# Patient Record
Sex: Male | Born: 1975 | Race: Asian | Hispanic: No | Marital: Married
Health system: Southern US, Community
[De-identification: ages and names within clinical notes are randomized; demographics above are authoritative.]

---

## 2013-03-24 ENCOUNTER — Ambulatory Visit: Payer: Self-pay | Admitting: Gastroenterology

## 2013-03-31 ENCOUNTER — Ambulatory Visit: Payer: Self-pay | Admitting: Gastroenterology

## 2014-12-14 ENCOUNTER — Other Ambulatory Visit: Payer: BC Managed Care – PPO

## 2014-12-14 DIAGNOSIS — Z9852 Vasectomy status: Secondary | ICD-10-CM

## 2014-12-15 LAB — SEMEN ANALYSIS, BASIC

## 2014-12-18 LAB — SPECIMEN STATUS REPORT

## 2014-12-18 LAB — POST-VAS SPERM EVALUATION,QUAL
POST-VAS SPERM, QUAL: ABSENT
SEMEN VOLUME: 2 mL

## 2014-12-20 ENCOUNTER — Telehealth: Payer: Self-pay

## 2014-12-20 NOTE — Telephone Encounter (Signed)
-----   Message from Fernanda Drum, FNP sent at 12/20/2014  8:14 AM EDT ----- Please notify patient that his semen analysis was negative.  thanks

## 2014-12-20 NOTE — Telephone Encounter (Signed)
Spoke with pt in reference to semen analysis. Pt voiced understanding.  

## 2015-03-05 IMAGING — RF DG BARIUM SWALLOW
11 of 16 series · 14 of 23 positions shown · non-contrast
Comparison: None.

FLUOROSCOPY TIME:  2 min 12 seconds.

CLINICAL DATA: Dysphagia.  Possible achalasia.

EXAM:
ESOPHOGRAM/BARIUM SWALLOW
TECHNIQUE: Combined double contrast and single contrast examination performed
using effervescent crystals, thick barium liquid, and thin barium
liquid.

[Series 1: fluoro_barium 2fps_bw · 0.19mm/px · 2 of 10 frames shown (1 of 11)]
[frame 2/10]
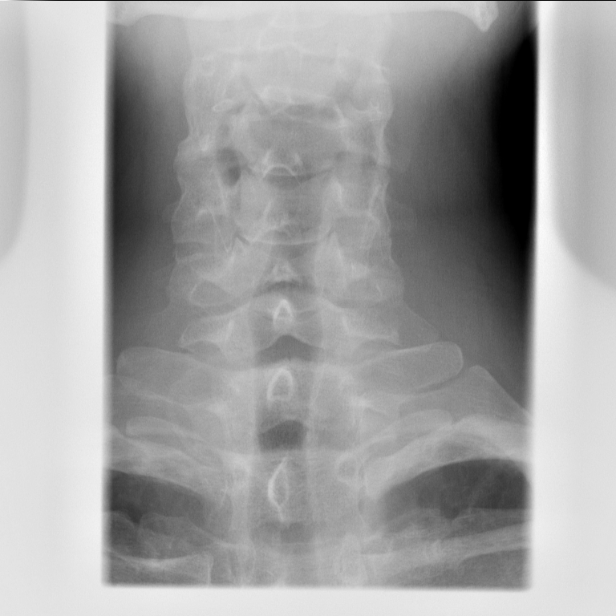
[frame 6/10]
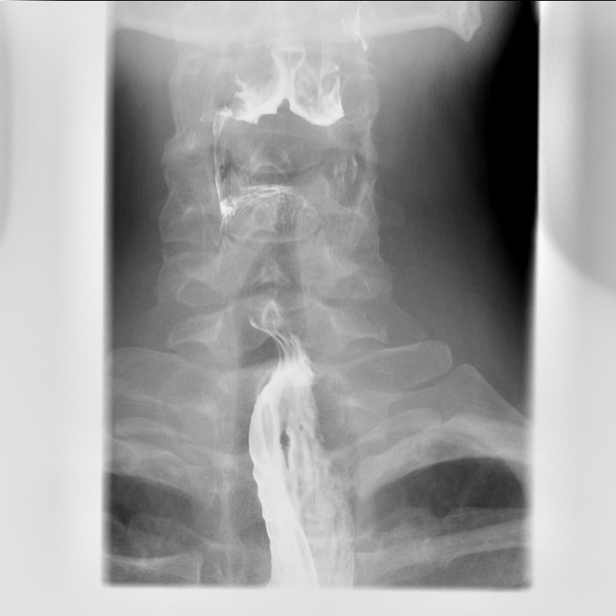

[Series 2: fluoro_barium 2fps_bw · 0.19mm/px · 2 of 7 frames shown (2 of 11)]
[frame 2/7]
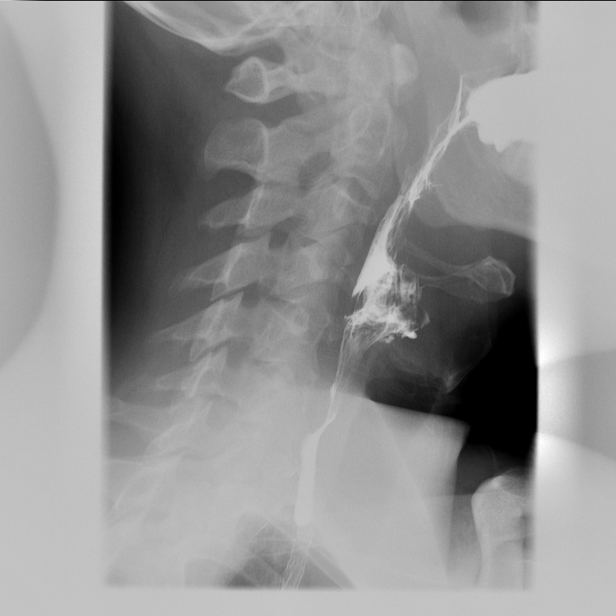
[frame 4/7]
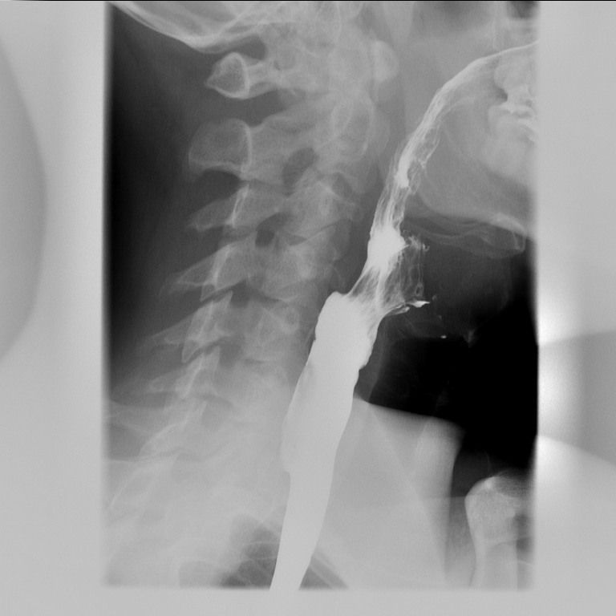

[Series 3: fluoro_barium 2fps_bw · 0.19mm/px · 1 of 1 slices shown (3 of 11)]
[im 1/1]
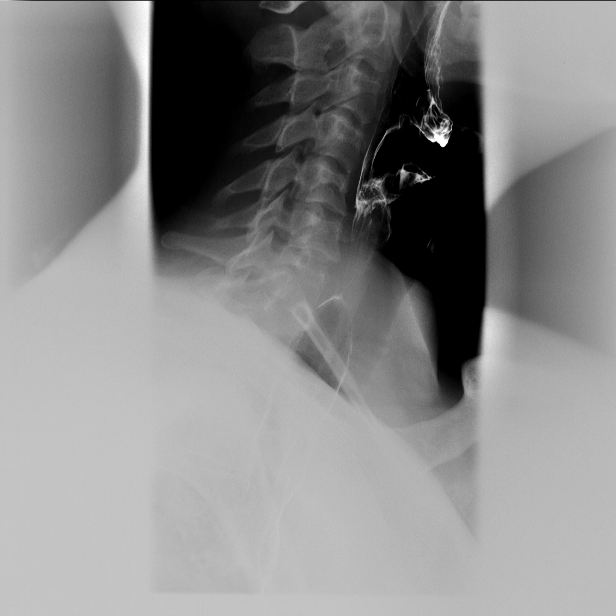

[Series 4: fluoro_barium 2fps_bw · 0.19mm/px · 2 of 11 frames shown (4 of 11)]
[frame 6/11]
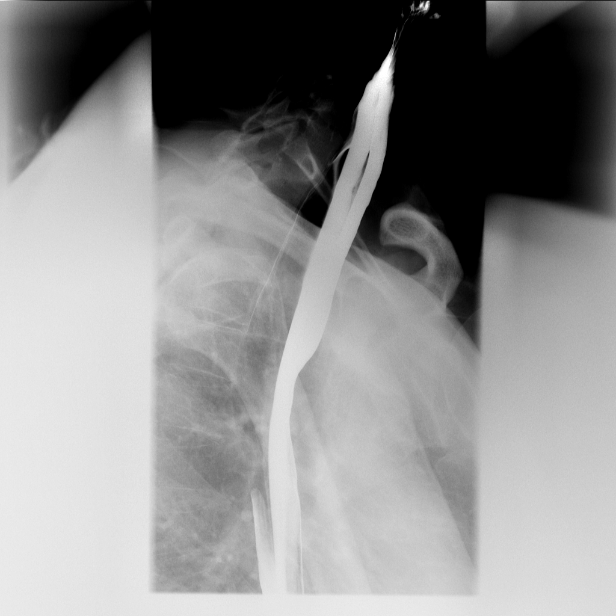
[frame 10/11]
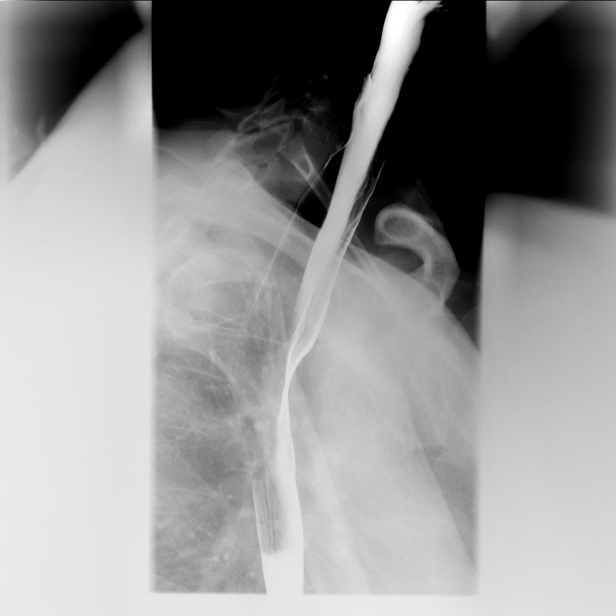

[Series 6: fluoro_barium 2fps_bw · 0.19mm/px · 1 of 1 slices shown (5 of 11)]
[im 1/1]
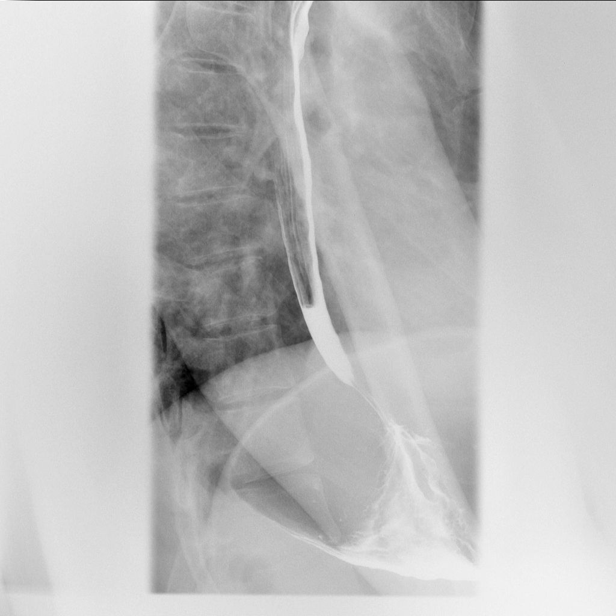

[Series 7: fluoro_barium 2fps_bw · 0.19mm/px · 1 of 1 slices shown (6 of 11)]
[im 1/1]
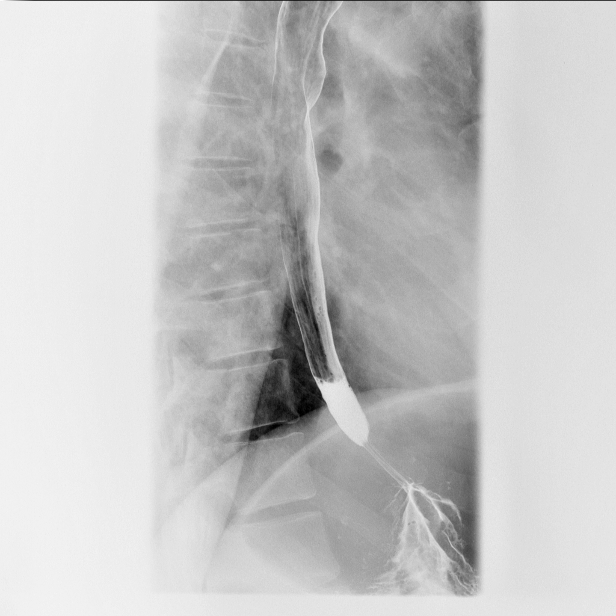

[Series 9: fluoro_barium 2fps_bw · 0.19mm/px · 1 of 1 slices shown (7 of 11)]
[im 1/1]
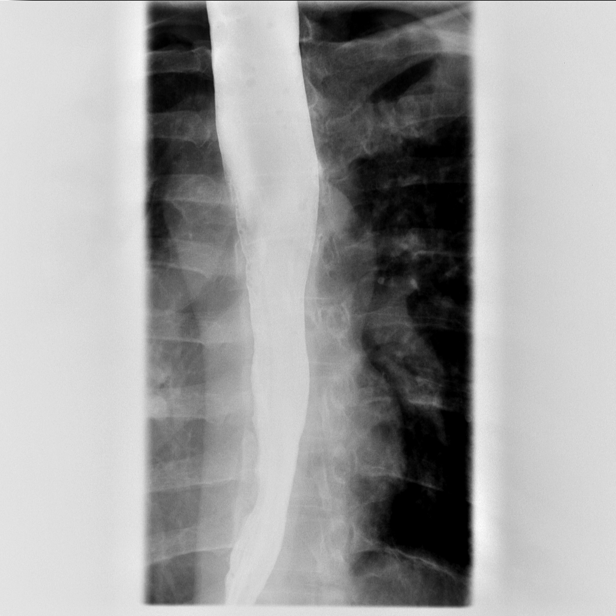

[Series 11: fluoro_barium 2fps_bw · 0.19mm/px · 1 of 1 slices shown (8 of 11)]
[im 1/1]
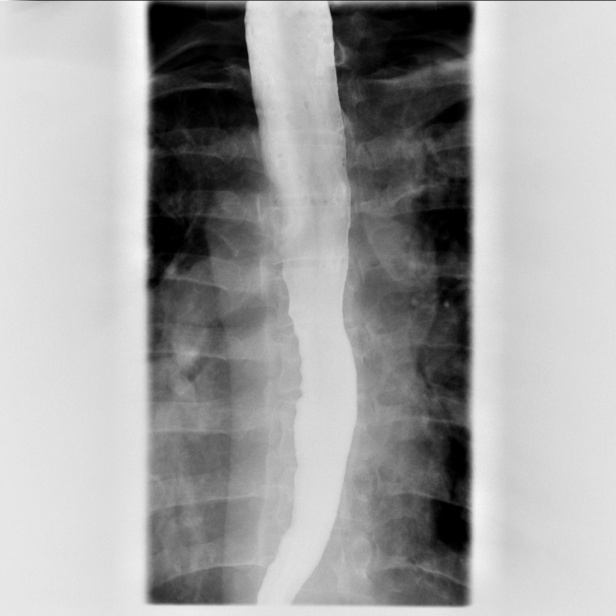

[Series 12: fluoro_barium 2fps_bw · 0.19mm/px · 1 of 1 slices shown (9 of 11)]
[im 1/1]
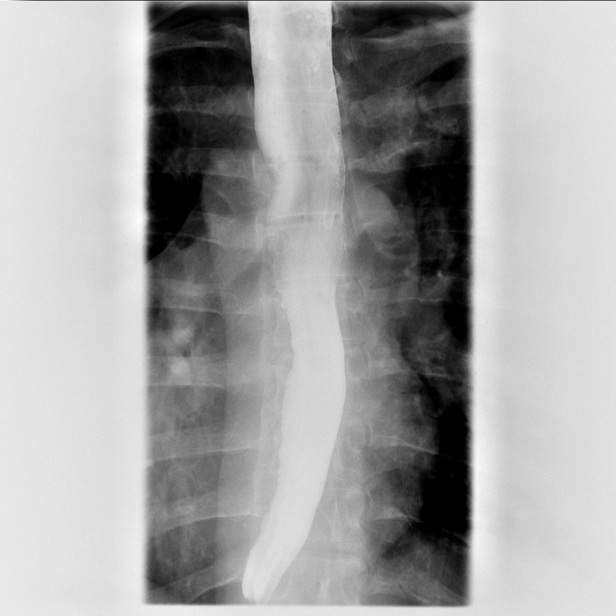

[Series 14: fluoro_barium 2fps_bw · 0.20mm/px · 1 of 1 slices shown (10 of 11)]
[im 1/1]
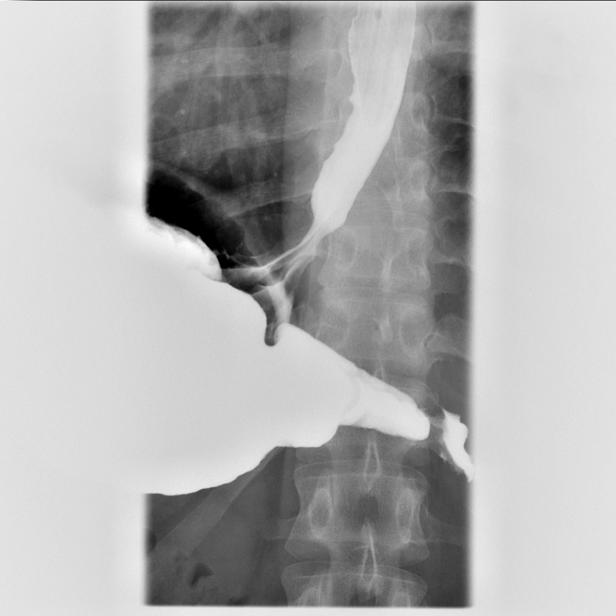

[Series 16: fluoro_barium 2fps_bw · 0.20mm/px · 1 of 1 slices shown (11 of 11)]
[im 1/1]
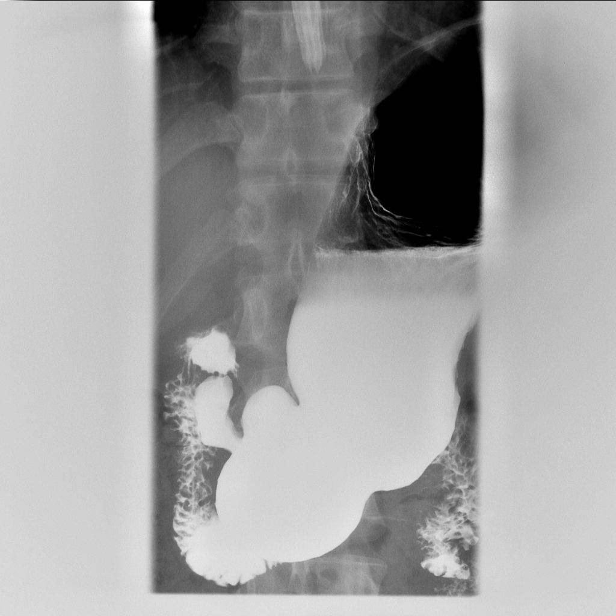

[14 of 23 positions shown; findings below may reference images not displayed]

FINDINGS: Cervical esophagus is normal. The thoracic esophagus is mildly to
moderately distended. No normal peristaltic waves are noted. Mild
tertiary contractions are present. The distal esophagus is slightly
narrowed. These findings suggest achalasia. Other etiologies of
these findings would include a neuropathy from any etiology and
collagen vascular disease such as scleroderma. No reflux could be
demonstrated. Barium tablet passed easily in an upright position. No
mass lesions.
IMPRESSION: Findings suggesting the possibility of achalasia. These results will
be called to the ordering clinician or representative by the
Radiologist Assistant, and communication documented in the PACS
Dashboard.
